# Patient Record
Sex: Female | Born: 1995 | Race: White | Hispanic: No | Marital: Single | Smoking: Never smoker
Health system: Southern US, Community
[De-identification: ages and names within clinical notes are randomized; demographics above are authoritative.]

## PROBLEM LIST (undated history)

## (undated) DIAGNOSIS — E079 Disorder of thyroid, unspecified: Secondary | ICD-10-CM

## (undated) DIAGNOSIS — J45909 Unspecified asthma, uncomplicated: Secondary | ICD-10-CM

## (undated) DIAGNOSIS — K900 Celiac disease: Secondary | ICD-10-CM

## (undated) DIAGNOSIS — E282 Polycystic ovarian syndrome: Secondary | ICD-10-CM

## (undated) HISTORY — PX: TONSILECTOMY, ADENOIDECTOMY, BILATERAL MYRINGOTOMY AND TUBES: SHX2538

## (undated) HISTORY — PX: TONSILLECTOMY: SUR1361

---
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
## 2013-09-10 DIAGNOSIS — K900 Celiac disease: Secondary | ICD-10-CM

## 2013-09-10 HISTORY — DX: Celiac disease: K90.0

## 2014-12-07 ENCOUNTER — Emergency Department (HOSPITAL_COMMUNITY)
Admission: EM | Admit: 2014-12-07 | Discharge: 2014-12-07 | Disposition: A | Discharge status: Home / Self Care | Payer: BLUE CROSS/BLUE SHIELD | Attending: Emergency Medicine | Admitting: Emergency Medicine

## 2014-12-07 ENCOUNTER — Encounter (HOSPITAL_COMMUNITY): Payer: Self-pay | Admitting: Emergency Medicine

## 2014-12-07 DIAGNOSIS — R1011 Right upper quadrant pain: Secondary | ICD-10-CM | POA: Insufficient documentation

## 2014-12-07 DIAGNOSIS — Z8639 Personal history of other endocrine, nutritional and metabolic disease: Secondary | ICD-10-CM | POA: Diagnosis not present

## 2014-12-07 DIAGNOSIS — Z3202 Encounter for pregnancy test, result negative: Secondary | ICD-10-CM | POA: Insufficient documentation

## 2014-12-07 DIAGNOSIS — R1012 Left upper quadrant pain: Secondary | ICD-10-CM | POA: Insufficient documentation

## 2014-12-07 DIAGNOSIS — Z7951 Long term (current) use of inhaled steroids: Secondary | ICD-10-CM | POA: Diagnosis not present

## 2014-12-07 DIAGNOSIS — Z8719 Personal history of other diseases of the digestive system: Secondary | ICD-10-CM | POA: Diagnosis not present

## 2014-12-07 DIAGNOSIS — Z79899 Other long term (current) drug therapy: Secondary | ICD-10-CM | POA: Diagnosis not present

## 2014-12-07 HISTORY — DX: Celiac disease: K90.0

## 2014-12-07 HISTORY — DX: Polycystic ovarian syndrome: E28.2

## 2014-12-07 LAB — URINALYSIS, ROUTINE W REFLEX MICROSCOPIC
Bilirubin Urine: NEGATIVE
GLUCOSE, UA: NEGATIVE mg/dL
Hgb urine dipstick: NEGATIVE
Ketones, ur: NEGATIVE mg/dL
Leukocytes, UA: NEGATIVE
Nitrite: NEGATIVE
PROTEIN: NEGATIVE mg/dL
Specific Gravity, Urine: 1.017 (ref 1.005–1.030)
Urobilinogen, UA: 0.2 mg/dL (ref 0.0–1.0)
pH: 6 (ref 5.0–8.0)

## 2014-12-07 LAB — COMPREHENSIVE METABOLIC PANEL
ALK PHOS: 61 U/L (ref 39–117)
ALT: 19 U/L (ref 0–35)
ANION GAP: 9 (ref 5–15)
AST: 22 U/L (ref 0–37)
Albumin: 4.2 g/dL (ref 3.5–5.2)
BILIRUBIN TOTAL: 0.2 mg/dL — AB (ref 0.3–1.2)
BUN: 13 mg/dL (ref 6–23)
CALCIUM: 9.4 mg/dL (ref 8.4–10.5)
CHLORIDE: 107 mmol/L (ref 96–112)
CO2: 25 mmol/L (ref 19–32)
CREATININE: 0.77 mg/dL (ref 0.50–1.10)
GFR calc Af Amer: >90 mL/min (ref >90)
GFR calc non Af Amer: >90 mL/min (ref >90)
GLUCOSE: 109 mg/dL — AB (ref 70–99)
POTASSIUM: 4 mmol/L (ref 3.5–5.1)
Sodium: 141 mmol/L (ref 135–145)
Total Protein: 7.4 g/dL (ref 6.0–8.3)

## 2014-12-07 LAB — CBC WITH DIFFERENTIAL/PLATELET
Basophils Absolute: 0 10*3/uL (ref 0.0–0.1)
Basophils Relative: 0 % (ref 0–1)
Eosinophils Absolute: 0.3 10*3/uL (ref 0.0–0.7)
Eosinophils Relative: 3 % (ref 0–5)
HEMATOCRIT: 40.7 % (ref 36.0–46.0)
HEMOGLOBIN: 13.3 g/dL (ref 12.0–15.0)
Lymphocytes Relative: 42 % (ref 12–46)
Lymphs Abs: 4 10*3/uL (ref 0.7–4.0)
MCH: 29.2 pg (ref 26.0–34.0)
MCHC: 32.7 g/dL (ref 30.0–36.0)
MCV: 89.3 fL (ref 78.0–100.0)
MONO ABS: 0.7 10*3/uL (ref 0.1–1.0)
MONOS PCT: 8 % (ref 3–12)
NEUTROS PCT: 47 % (ref 43–77)
Neutro Abs: 4.7 10*3/uL (ref 1.7–7.7)
Platelets: 354 10*3/uL (ref 150–400)
RBC: 4.56 MIL/uL (ref 3.87–5.11)
RDW: 13.3 % (ref 11.5–15.5)
WBC: 9.7 10*3/uL (ref 4.0–10.5)

## 2014-12-07 LAB — POC URINE PREG, ED: Preg Test, Ur: NEGATIVE

## 2014-12-07 LAB — LIPASE, BLOOD: LIPASE: 48 U/L (ref 11–59)

## 2014-12-07 MED ORDER — ONDANSETRON 8 MG PO TBDP: 8.0000 mg | ORAL_TABLET | Freq: Three times a day (TID) | ORAL | Status: DC | PRN

## 2014-12-07 MED ORDER — ONDANSETRON 8 MG PO TBDP
8.0000 mg | ORAL_TABLET | Freq: Once | ORAL | Status: AC
  Administered 2014-12-07: 8 mg via ORAL
  Filled 2014-12-07: qty 1

## 2014-12-07 NOTE — ED Provider Notes (Signed)
CSN: [id number]     Arrival date & time 12/07/14  0049 History   First MD Initiated Contact with Patient 12/07/14 0539     Chief Complaint  Patient presents with  . Abdominal Pain     (Consider location/radiation/quality/duration/timing/severity/associated sxs/prior Treatment) HPI  This is a 19 year old female with known celiac disease. She has had a one-month history of a feeling of abdominal bloating with intermittent pain. The pain occurs primarily in the epigastrium and right upper quadrant. It is sharp in nature. There does not seem to be a particular trigger; it is not associated with food. The pain became severe yesterday evening and was associated with nausea but no vomiting or diarrhea. She was given Zofran with relief of the nausea and the pain is subsequently significantly improved. Her pain now is mild, worse with palpation. There was no associated fever or chills. An ultrasound of her gallbladder has been discussed but has not been scheduled by her gastroenterologist.  Past Medical History  Diagnosis Date  . PCOS (polycystic ovarian syndrome)   . Celiac disease    Past Surgical History  Procedure Laterality Date  . Tonsillectomy     History reviewed. No pertinent family history. History  Substance Use Topics  . Smoking status: Never Smoker   . Smokeless tobacco: Not on file  . Alcohol Use: Yes     Comment: occ   OB History    No data available     Review of Systems  All other systems reviewed and are negative.   Allergies  Gluten meal  Home Medications   Prior to Admission medications   Medication Sig Start Date End Date Taking? Authorizing Provider  dicyclomine (BENTYL) 10 MG capsule Take 10-20 mg by mouth 2 (two) times daily. Take 1 capsule in the morning Take 2 capsules in the evening   Yes Historical Provider, MD  fluticasone (FLONASE) 50 MCG/ACT nasal spray Place 1 spray into both nostrils daily.   Yes Historical Provider, MD  loratadine (CLARITIN) 10  MG tablet Take 10 mg by mouth daily.   Yes Historical Provider, MD  metFORMIN (GLUCOPHAGE) 500 MG tablet Take 500 mg by mouth 2 (two) times daily with a meal.   Yes Historical Provider, MD  omeprazole (PRILOSEC) 20 MG capsule Take 20 mg by mouth daily.   Yes Historical Provider, MD  PRESCRIPTION MEDICATION Take 1 tablet by mouth daily. Birth control   Yes Historical Provider, MD  pseudoephedrine (SUDAFED) 30 MG tablet Take 30 mg by mouth every 4 (four) hours as needed for congestion.   Yes Historical Provider, MD  rifaximin (XIFAXAN) 550 MG TABS tablet Take 550 mg by mouth daily.   Yes Historical Provider, MD   BP 115/60 mmHg  Pulse 79  Temp(Src) 98.1 F (36.7 C) (Oral)  Resp 18  Ht  (1.676 m)  Wt 180 lb (81.647 kg)  BMI 29.07 kg/m2  SpO2 99%  LMP 11/16/2014 (Approximate)   Physical Exam  General: Well-developed, well-nourished female in no acute distress; appearance consistent with age of record HENT: normocephalic; atraumatic Eyes: pupils equal, round and reactive to light; extraocular muscles intact Neck: supple Heart: regular rate and rhythm Lungs: clear to auscultation bilaterally Abdomen: soft; nondistended; mild left upper quadrant and right upper quadrant tenderness; no masses or hepatosplenomegaly; bowel sounds present; gallbladder not visualized on bedside ultrasound Extremities: No deformity; full range of motion; pulses normal Neurologic: Awake, alert and oriented; motor function intact in all extremities and symmetric; no facial droop Skin:  Warm and dry Psychiatric: Normal mood and affect    ED Course  Procedures (including critical care time)   MDM   Nursing notes and vitals signs, including pulse oximetry, reviewed.  Summary of this visit's results, reviewed by myself:  Labs:  Results for orders placed or performed during the hospital encounter of 12/07/14 (from the past 24 hour(s))  Urinalysis, Routine w reflex microscopic     Status: None    Collection Time: 12/07/14  1:08 AM  Result Value Ref Range   Color, Urine YELLOW YELLOW   APPearance CLEAR CLEAR   Specific Gravity, Urine 1.017 1.005 - 1.030   pH 6.0 5.0 - 8.0   Glucose, UA NEGATIVE NEGATIVE mg/dL   Hgb urine dipstick NEGATIVE NEGATIVE   Bilirubin Urine NEGATIVE NEGATIVE   Ketones, ur NEGATIVE NEGATIVE mg/dL   Protein, ur NEGATIVE NEGATIVE mg/dL   Urobilinogen, UA 0.2 0.0 - 1.0 mg/dL   Nitrite NEGATIVE NEGATIVE   Leukocytes, UA NEGATIVE NEGATIVE  CBC with Differential     Status: None   Collection Time: 12/07/14  1:11 AM  Result Value Ref Range   WBC 9.7 4.0 - 10.5 K/uL   RBC 4.56 3.87 - 5.11 MIL/uL   Hemoglobin 13.3 12.0 - 15.0 g/dL   HCT 04.5 40.9 - 81.1 %   MCV 89.3 78.0 - 100.0 fL   MCH 29.2 26.0 - 34.0 pg   MCHC 32.7 30.0 - 36.0 g/dL   RDW 91.4 78.2 - 95.6 %   Platelets 354 150 - 400 K/uL   Neutrophils Relative % 47 43 - 77 %   Neutro Abs 4.7 1.7 - 7.7 K/uL   Lymphocytes Relative 42 12 - 46 %   Lymphs Abs 4.0 0.7 - 4.0 K/uL   Monocytes Relative 8 3 - 12 %   Monocytes Absolute 0.7 0.1 - 1.0 K/uL   Eosinophils Relative 3 0 - 5 %   Eosinophils Absolute 0.3 0.0 - 0.7 K/uL   Basophils Relative 0 0 - 1 %   Basophils Absolute 0.0 0.0 - 0.1 K/uL  Comprehensive metabolic panel     Status: Abnormal   Collection Time: 12/07/14  1:11 AM  Result Value Ref Range   Sodium 141 135 - 145 mmol/L   Potassium 4.0 3.5 - 5.1 mmol/L   Chloride 107 96 - 112 mmol/L   CO2 25 19 - 32 mmol/L   Glucose, Bld 109 (H) 70 - 99 mg/dL   BUN 13 6 - 23 mg/dL   Creatinine, Ser 2.13 0.50 - 1.10 mg/dL   Calcium 9.4 8.4 - 08.6 mg/dL   Total Protein 7.4 6.0 - 8.3 g/dL   Albumin 4.2 3.5 - 5.2 g/dL   AST 22 0 - 37 U/L   ALT 19 0 - 35 U/L   Alkaline Phosphatase 61 39 - 117 U/L   Total Bilirubin 0.2 (L) 0.3 - 1.2 mg/dL   GFR calc non Af Amer >90 >90 mL/min   GFR calc Af Amer >90 >90 mL/min   Anion gap 9 5 - 15  Lipase, blood     Status: None   Collection Time: 12/07/14  1:11 AM   Result Value Ref Range   Lipase 48 11 - 59 U/L  POC Urine Pregnancy, ED  (If Pre-menopausal female) - do not order at [facility]     Status: None   Collection Time: 12/07/14  1:54 AM  Result Value Ref Range   Preg Test, Ur NEGATIVE NEGATIVE   5:52 AM  We'll schedule an outpatient ultrasound of the abdomen.  Paula LibraJohn Atlas Crossland, MD 12/07/14 [phone]

## 2014-12-07 NOTE — ED Notes (Signed)
Pt resting quietly with no acute distress, watching television when entering the room.

## 2014-12-07 NOTE — ED Notes (Signed)
Patient reports abd pain, states she has ciliac disease, recently was scoped and was to be scheduled for ultrasound to r/o gallbladder. Patient c/o nausea, denies vomiting, states she feels bloated for @ 1 month.

## 2014-12-30 ENCOUNTER — Ambulatory Visit (INDEPENDENT_AMBULATORY_CARE_PROVIDER_SITE_OTHER): Payer: BLUE CROSS/BLUE SHIELD | Admitting: Physician Assistant

## 2014-12-30 VITALS — BP 110/70 | HR 86 | Temp 97.8°F | Resp 17 | Ht 65.0 in | Wt 190.0 lb

## 2014-12-30 DIAGNOSIS — J069 Acute upper respiratory infection, unspecified: Secondary | ICD-10-CM | POA: Diagnosis not present

## 2014-12-30 DIAGNOSIS — K900 Celiac disease: Secondary | ICD-10-CM | POA: Insufficient documentation

## 2014-12-30 DIAGNOSIS — B9789 Other viral agents as the cause of diseases classified elsewhere: Principal | ICD-10-CM

## 2014-12-30 DIAGNOSIS — J309 Allergic rhinitis, unspecified: Secondary | ICD-10-CM | POA: Diagnosis not present

## 2014-12-30 MED ORDER — FLUTICASONE PROPIONATE 50 MCG/ACT NA SUSP: 2.0000 | Freq: Every day | NASAL | Status: AC

## 2014-12-30 MED ORDER — HYDROCOD POLST-CPM POLST ER 10-8 MG/5ML PO SUER: 5.0000 mL | Freq: Two times a day (BID) | ORAL | Status: AC | PRN

## 2014-12-30 NOTE — Progress Notes (Signed)
Subjective:    Patient ID: Holly Gibbs, female    DOB: 05/09/1996, 19 y.o.   MRN: [id number]  HPI  This is a 19 year old female with PMH allergic rhinitis and celiac disease who is presenting with 3 days of nasal congestion, dry cough and bilateral ear pain. Some nasal discharge. Mild sore throat. Cough is worse at night. Denies fever, chills, SOB, wheezing. Takes sudafed and claritin daily for allergies. She generally uses flonase daily as well but has run out. She has been using saline nasal spray and mucinex for current symptoms and helping some. She does not have a history of asthma and she is not a smoker.  Review of Systems  Constitutional: Negative for fever and chills.  HENT: Positive for congestion, ear pain and sore throat. Negative for ear discharge and sinus pressure.   Eyes: Negative for redness.  Respiratory: Positive for cough. Negative for shortness of breath and wheezing.   Gastrointestinal: Negative for nausea and vomiting.  Skin: Negative for rash.  Allergic/Immunologic: Positive for environmental allergies.  Hematological: Negative for adenopathy.  Psychiatric/Behavioral: Positive for sleep disturbance.    There are no active problems to display for this patient.  Prior to Admission medications   Medication Sig Start Date End Date Taking? Authorizing Provider  dicyclomine (BENTYL) 10 MG capsule Take 10-20 mg by mouth 2 (two) times daily. Take 1 capsule in the morning Take 2 capsules in the evening   Yes Historical Provider, MD  loratadine (CLARITIN) 10 MG tablet Take 10 mg by mouth daily.   Yes Historical Provider, MD  metFORMIN (GLUCOPHAGE) 500 MG tablet Take 500 mg by mouth 2 (two) times daily with a meal.   Yes Historical Provider, MD  PRESCRIPTION MEDICATION Take 1 tablet by mouth daily. Birth control   Yes Historical Provider, MD  pseudoephedrine (SUDAFED) 30 MG tablet Take 30 mg by mouth every 4 (four) hours as needed for congestion.   Yes Historical  Provider, MD                               Allergies  Allergen Reactions  . Gluten Meal Other (See Comments)    sensitivity   Patient's social and family history were reviewed.     Objective:   Physical Exam  Constitutional: She is oriented to person, place, and time. She appears well-developed and well-nourished. No distress.  HENT:  Head: Normocephalic and atraumatic.  Right Ear: Hearing, external ear and ear canal normal. A middle ear effusion is present.  Left Ear: Hearing, external ear and ear canal normal. A middle ear effusion is present.  Nose: Mucosal edema present. Right sinus exhibits no maxillary sinus tenderness and no frontal sinus tenderness. Left sinus exhibits no maxillary sinus tenderness and no frontal sinus tenderness.  Mouth/Throat: Uvula is midline and mucous membranes are normal. Posterior oropharyngeal erythema present. No oropharyngeal exudate or posterior oropharyngeal edema.  Eyes: Conjunctivae and lids are normal. Right eye exhibits no discharge. Left eye exhibits no discharge. No scleral icterus.  Cardiovascular: Normal rate, regular rhythm, normal heart sounds and normal pulses.   No murmur heard. Pulmonary/Chest: Effort normal and breath sounds normal. No respiratory distress. She has no wheezes. She has no rhonchi. She has no rales.  Musculoskeletal: Normal range of motion.  Lymphadenopathy:       Head (right side): No submental, no submandibular and no tonsillar adenopathy present.       Head (  left side): No submental, no submandibular and no tonsillar adenopathy present.    She has no cervical adenopathy.  Neurological: She is alert and oriented to person, place, and time.  Skin: Skin is warm, dry and intact. No lesion and no rash noted.  Psychiatric: She has a normal mood and affect. Her speech is normal and behavior is normal. Thought content normal.   BP 110/70 mmHg  Pulse 86  Temp(Src) 97.8 F (36.6 C) (Oral)  Resp 17  Ht  (1.651 m)   Wt 190 lb (86.183 kg)  BMI 31.62 kg/m2  SpO2 99%  LMP 11/16/2014 (Approximate)     Assessment & Plan:  1. Viral URI with cough 2. Allergic rhinitis Tussionex and mucinex for viral URI. Flonase for URI and to be continued for allergic rhinitis. She will return if symptoms do not improve in 7-10 days.  - fluticasone (FLONASE) 50 MCG/ACT nasal spray; Place 2 sprays into both nostrils daily.  Dispense: 16 g; Refill: 12 - chlorpheniramine-HYDROcodone (TUSSIONEX PENNKINETIC ER) 10-8 MG/5ML SUER; Take 5 mLs by mouth every 12 (twelve) hours as needed for cough.  Dispense: 100 mL; Refill: 0   [place] [employer]. Dyke Brackett, MHS [facility]  12/30/2014

## 2014-12-30 NOTE — Patient Instructions (Signed)
Continue daily claritin and sudafed. Start using flonase again daily. Use cough syrup at night for sleep. Return in 7-10 days if symptoms are not improving.

## 2017-07-26 ENCOUNTER — Other Ambulatory Visit: Payer: Self-pay

## 2017-07-26 ENCOUNTER — Emergency Department (HOSPITAL_COMMUNITY)
Admission: EM | Admit: 2017-07-26 | Discharge: 2017-07-26 | Disposition: A | Discharge status: Home / Self Care | Payer: BLUE CROSS/BLUE SHIELD | Attending: Emergency Medicine | Admitting: Emergency Medicine

## 2017-07-26 ENCOUNTER — Emergency Department (HOSPITAL_COMMUNITY): Payer: BLUE CROSS/BLUE SHIELD

## 2017-07-26 ENCOUNTER — Encounter (HOSPITAL_COMMUNITY): Payer: Self-pay

## 2017-07-26 DIAGNOSIS — J302 Other seasonal allergic rhinitis: Secondary | ICD-10-CM | POA: Diagnosis not present

## 2017-07-26 DIAGNOSIS — J45909 Unspecified asthma, uncomplicated: Secondary | ICD-10-CM | POA: Insufficient documentation

## 2017-07-26 DIAGNOSIS — R519 Headache, unspecified: Secondary | ICD-10-CM

## 2017-07-26 DIAGNOSIS — E079 Disorder of thyroid, unspecified: Secondary | ICD-10-CM | POA: Insufficient documentation

## 2017-07-26 DIAGNOSIS — R51 Headache: Secondary | ICD-10-CM | POA: Insufficient documentation

## 2017-07-26 DIAGNOSIS — Z79899 Other long term (current) drug therapy: Secondary | ICD-10-CM | POA: Diagnosis not present

## 2017-07-26 HISTORY — DX: Disorder of thyroid, unspecified: E07.9

## 2017-07-26 HISTORY — DX: Unspecified asthma, uncomplicated: J45.909

## 2017-07-26 LAB — I-STAT BETA HCG BLOOD, ED (MC, WL, AP ONLY): I-stat hCG, quantitative: <5 m[IU]/mL (ref <5)

## 2017-07-26 MED ORDER — METOCLOPRAMIDE HCL 5 MG/ML IJ SOLN
10.0000 mg | Freq: Once | INTRAMUSCULAR | Status: AC
  Administered 2017-07-26: 10 mg via INTRAVENOUS
  Filled 2017-07-26: qty 2

## 2017-07-26 MED ORDER — BUTALBITAL-APAP-CAFFEINE 50-325-40 MG PO TABS: 1.0000 | ORAL_TABLET | Freq: Three times a day (TID) | ORAL | 0 refills | Status: AC | PRN

## 2017-07-26 MED ORDER — SODIUM CHLORIDE 0.9 % IV BOLUS (SEPSIS)
1000.0000 mL | Freq: Once | INTRAVENOUS | Status: AC
  Administered 2017-07-26: 1000 mL via INTRAVENOUS

## 2017-07-26 MED ORDER — KETOROLAC TROMETHAMINE 30 MG/ML IJ SOLN
30.0000 mg | Freq: Once | INTRAMUSCULAR | Status: AC
  Administered 2017-07-26: 30 mg via INTRAVENOUS
  Filled 2017-07-26: qty 1

## 2017-07-26 NOTE — ED Triage Notes (Addendum)
Pt presents to ED with a headache that started today and hasn't been able to get rid of it.  Pt states she iced her head and took tylenol with no relief. Pt also states she has left arm nerve pain from elbow to her pinky that started 4 days ago. Pt denies a fever, cough or congestion.

## 2017-07-26 NOTE — ED Provider Notes (Signed)
[facility]-EMERGENCY DEPT Provider Note   CSN: [id number] Arrival date & time: 07/26/17  0007    History   Chief Complaint Chief Complaint  Patient presents with  . Headache    posterior and down right neck    HPI Holly Gibbs is a 21 y.o. female.  21 y/o female with hx of thyroid disease, celiac, and PCOS presents to the ED for evaluation of headache.  Patient reports a right parietal headache which started today and has been constant and gradually worsening.  She specifically noted worsening at 2000 this evening at which time she took ibuprofen without relief.  She has also tried icing the area of pain without improvement.  She had a small amount of caffeine in her coffee this morning.  She denies any head trauma or loss of consciousness.  No recent fevers.  She has not had any blurry vision, vision loss, tinnitus, hearing loss, difficulty speaking or swallowing, she was extremity weakness, inability to ambulate.  Patient does report onset of severe pain in her left upper extremity 4 days ago.  She is right-hand dominant.  She denies any trauma or injury to her left upper extremity, but had severe, "searing" pain along the ulnar aspect of her forearm extending to her 5th finger.  This was worse with movement, but also aggravated "when a sheet was on my arm".  These symptoms have largely improved, but have not completely resolved.   The history is provided by the patient. No language interpreter was used.  Headache      Past Medical History:  Diagnosis Date  . Asthma    exercise induced  . Celiac disease   . Celiac disease 2015  . PCOS (polycystic ovarian syndrome)   . PCOS (polycystic ovarian syndrome) 2010  . Thyroid disease     Patient Active Problem List   Diagnosis Date Noted  . Celiac disease 12/30/2014  . Rhinitis, allergic 12/30/2014    Past Surgical History:  Procedure Laterality Date  . TONSILECTOMY, ADENOIDECTOMY, BILATERAL  MYRINGOTOMY AND TUBES    . TONSILLECTOMY      OB History    No data available       Home Medications    Prior to Admission medications   Medication Sig Start Date End Date Taking? Authorizing Provider  butalbital-acetaminophen-caffeine (FIORICET, ESGIC) 50-325-40 MG tablet Take 1-2 tablets every 8 (eight) hours as needed by mouth for headache. 07/26/17 07/26/18  Antony MaduraHumes, Leviathan Macera, PA-C  chlorpheniramine-HYDROcodone (TUSSIONEX PENNKINETIC ER) 10-8 MG/5ML SUER Take 5 mLs by mouth every 12 (twelve) hours as needed for cough. 12/30/14   Dorna LeitzBush, Nicole V, PA-C  dicyclomine (BENTYL) 10 MG capsule Take 10-20 mg by mouth 2 (two) times daily. Take 1 capsule in the morning Take 2 capsules in the evening    [provider]  fluticasone (FLONASE) 50 MCG/ACT nasal spray Place 2 sprays into both nostrils daily. 12/30/14   Dorna LeitzBush, Nicole V, PA-C  loratadine (CLARITIN) 10 MG tablet Take 10 mg by mouth daily.    [provider]  metFORMIN (GLUCOPHAGE) 500 MG tablet Take 500 mg by mouth 2 (two) times daily with a meal.    [provider]  omeprazole (PRILOSEC) 20 MG capsule Take 20 mg by mouth daily.    [provider]  PRESCRIPTION MEDICATION Take 1 tablet by mouth daily. Birth control    [provider]  pseudoephedrine (SUDAFED) 30 MG tablet Take 30 mg by mouth every 4 (four) hours as needed for  congestion.    [provider]    Family History Family History  Problem Relation Age of Onset  . Diabetes Father   . Thyroid disease Father     Social History Social History   Tobacco Use  . Smoking status: Never Smoker  . Smokeless tobacco: Never Used  Substance Use Topics  . Alcohol use: Yes    Alcohol/week: 0.6 oz    Types: 1 Standard drinks or equivalent per week    Comment: occ  . Drug use: No     Allergies   Gluten meal   Review of Systems Review of Systems  Neurological: Positive for headaches.  Ten systems reviewed and are negative  for acute change, except as noted in the HPI.    Physical Exam Updated Vital Signs BP 126/81   Pulse 75   Temp 97.7 F (36.5 C) (Oral)   Resp 18   Ht 5\' 6"  (1.676 m)   Wt 88.5 kg (195 lb)   LMP 07/11/2017 (Approximate)   SpO2 98%   BMI 31.47 kg/m   Physical Exam  Constitutional: She is oriented to person, place, and time. She appears well-developed and well-nourished. No distress.  Nontoxic appearing and in NAD  HENT:  Head: Normocephalic and atraumatic.  Mouth/Throat: Oropharynx is clear and moist.  Symmetric rise of the uvula with phonation  Eyes: Conjunctivae and EOM are normal. Pupils are equal, round, and reactive to light. No scleral icterus.  Neck: Normal range of motion.  No meningismus  Cardiovascular: Normal rate, regular rhythm and intact distal pulses.  Pulmonary/Chest: Effort normal. No stridor. No respiratory distress.  Respirations even and unlabored  Musculoskeletal: Normal range of motion.  Neurological: She is alert and oriented to person, place, and time. No cranial nerve deficit. She exhibits normal muscle tone. Coordination normal.  GCS 15. Speech is goal oriented. No cranial nerve deficits appreciated; symmetric eyebrow raise, no facial drooping, tongue midline. Patient has equal grip strength bilaterally with 5/5 strength against resistance in all major muscle groups bilaterally. Sensation to light touch intact. Patient moves extremities without ataxia. Normal finger-nose-finger.   Skin: Skin is warm and dry. No rash noted. She is not diaphoretic. No erythema. No pallor.  Psychiatric: She has a normal mood and affect. Her behavior is normal.  Nursing note and vitals reviewed.    ED Treatments / Results  Labs (all labs ordered are listed, but only abnormal results are displayed) Labs Reviewed  I-STAT BETA HCG BLOOD, ED ([facility], [facility], [facility] ONLY)    EKG  EKG Interpretation None       Radiology Ct Head Wo Contrast  Result Date: 07/26/2017 CLINICAL  DATA:  21 year old female with headache. EXAM: CT HEAD WITHOUT CONTRAST TECHNIQUE: Contiguous axial images were obtained from the base of the skull through the vertex without intravenous contrast. COMPARISON:  None. FINDINGS: Brain: No evidence of acute infarction, hemorrhage, hydrocephalus, extra-axial collection or mass lesion/mass effect. Vascular: No hyperdense vessel or unexpected calcification. Skull: Normal. Negative for fracture or focal lesion. Sinuses/Orbits: No acute finding. Other: None IMPRESSION: Normal unenhanced CT of the brain. Electronically Signed   By: Elgie Collard M.D.   On: 07/26/2017 03:27    Procedures Procedures (including critical care time)  Medications Ordered in ED Medications  ketorolac (TORADOL) 30 MG/ML injection 30 mg (30 mg Intravenous Given 07/26/17 0240)  metoCLOPramide (REGLAN) injection 10 mg (10 mg Intravenous Given 07/26/17 0240)  sodium chloride 0.9 % bolus 1,000 mL (1,000 mLs Intravenous New Bag/Given 07/26/17  [id number])    3:34 AM Patient reassessed. She reports significant improvement in headache. Denies need for additional medications.   Initial Impression / Assessment and Plan / ED Course  I have reviewed the triage vital signs and the nursing notes.  Pertinent labs & imaging results that were available during my care of the patient were reviewed by me and considered in my medical decision making (see chart for details).     21 year old female presents to the emergency department for evaluation of headache which began this morning.  It was gradual in onset and worsened at 2000.  She reports no improvement with home remedies and over-the-counter medications.  She has no history of recent head injury or trauma.  No fever, nuchal rigidity, meningismus to suggest meningitis.  Neurologic exam today is nonfocal.  CT obtained given lack of history of similar headache symptoms.  Noncontrasted CT is reassuring today.  The patient has had significant  improvement in her headache following a migraine cocktail.  I do not believe further emergent workup is indicated, but will refer to neurology should symptoms persist.  Return precautions discussed and provided.  Patient discharged in stable condition with no unaddressed concerns.    Final Clinical Impressions(s) / ED Diagnoses   Final diagnoses:  Bad headache    ED Discharge Orders        Ordered    butalbital-acetaminophen-caffeine (FIORICET, ESGIC) 50-325-40 MG tablet  Every 8 hours PRN     07/26/17 0333       Antony MaduraHumes, Wilberto Console, PA-C 07/26/17 [id number]    Palumbo, April, MD 07/26/17 [phone]

## 2019-01-22 IMAGING — CT CT HEAD W/O CM
3 of 4 series · 16 of 47 positions shown, 19 images · non-contrast
Comparison: None.

CLINICAL DATA: 21-year-old female with headache.

EXAM:
CT HEAD WITHOUT CONTRAST
TECHNIQUE: Contiguous axial images were obtained from the base of the skull
through the vertex without intravenous contrast.

[Series 2: head w/o · axial · non-contrast · 0.46mm/px · z∈[+1376,+1501]mm · 10 of 31 slices shown, 13 images]
[im 3/31  brain]
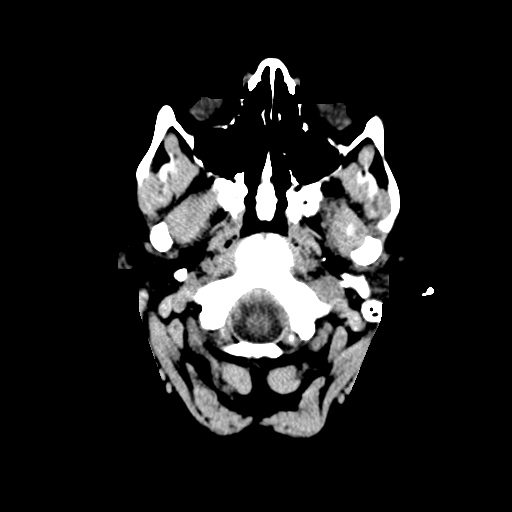
[im 3/31  bone]
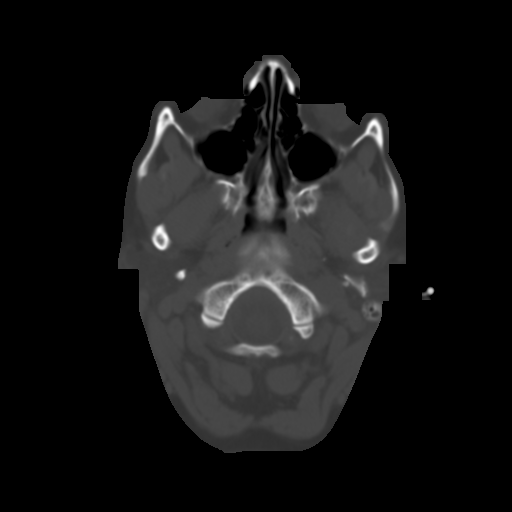
[im 5/31  brain]
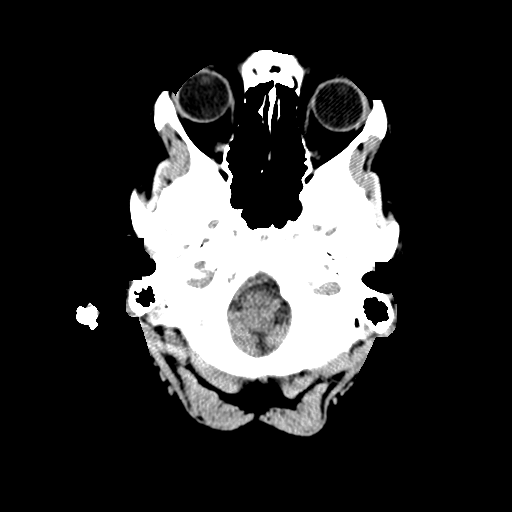
[im 9/31  brain]
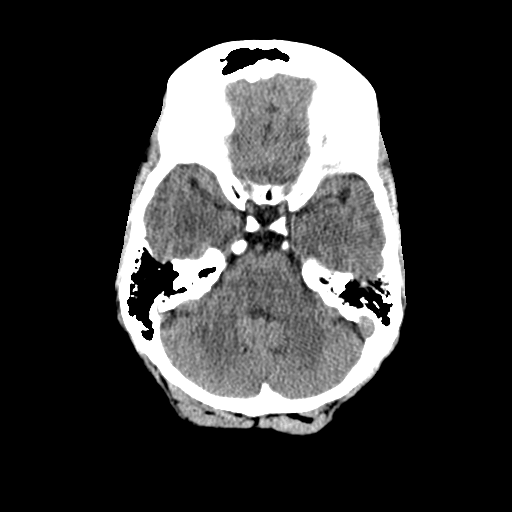
[im 11/31  brain]
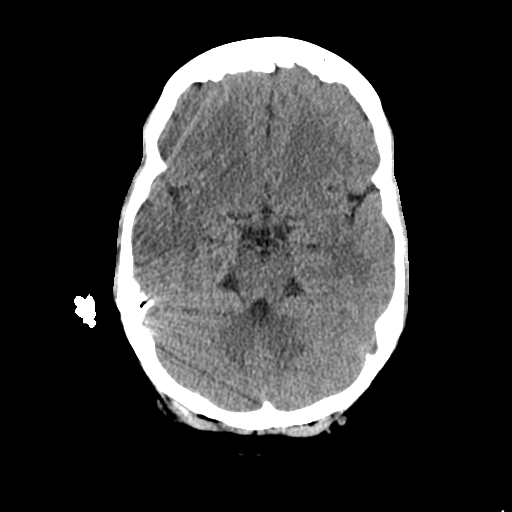
[im 13/31  brain]
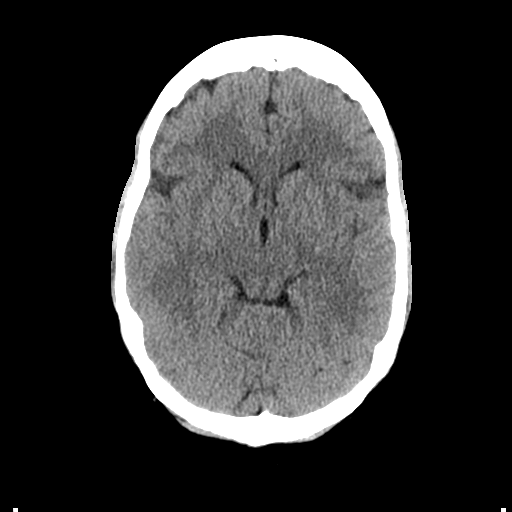
[im 13/31  bone]
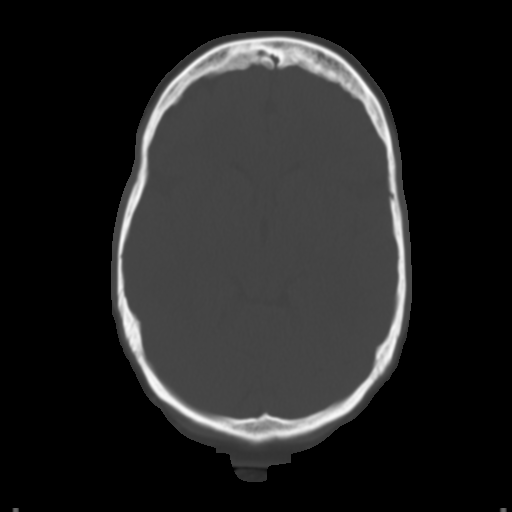
[im 18/31  brain]
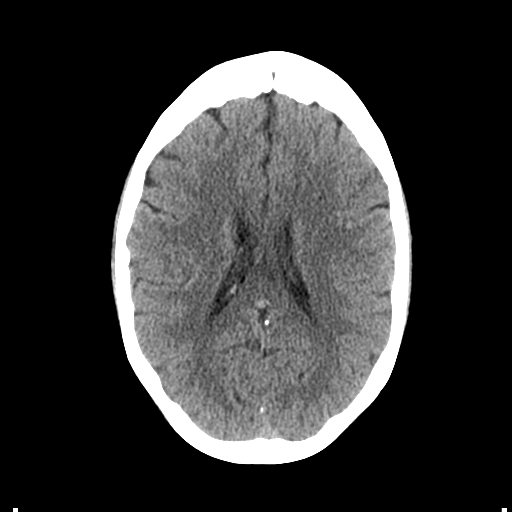
[im 20/31  brain]
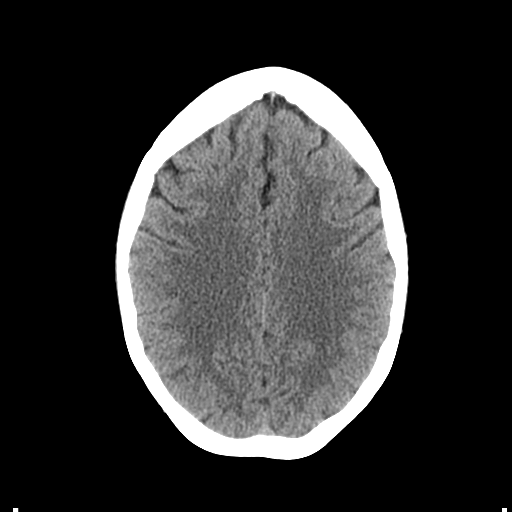
[im 22/31  brain]
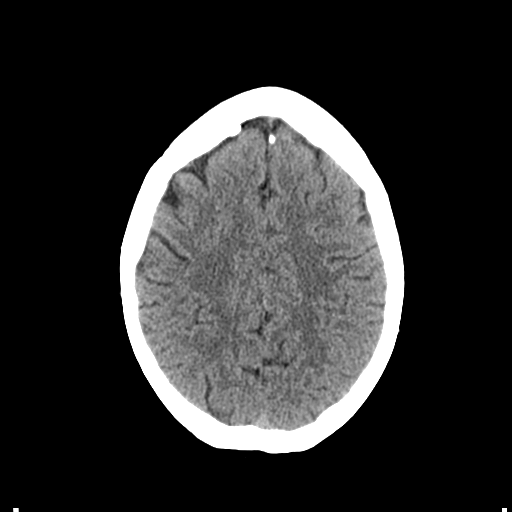
[im 26/31  brain]
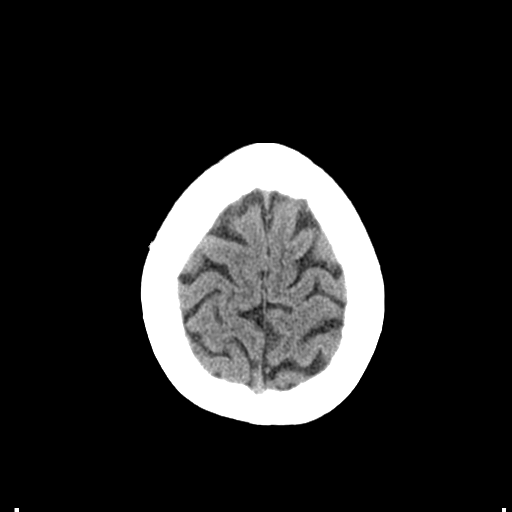
[im 26/31  bone]
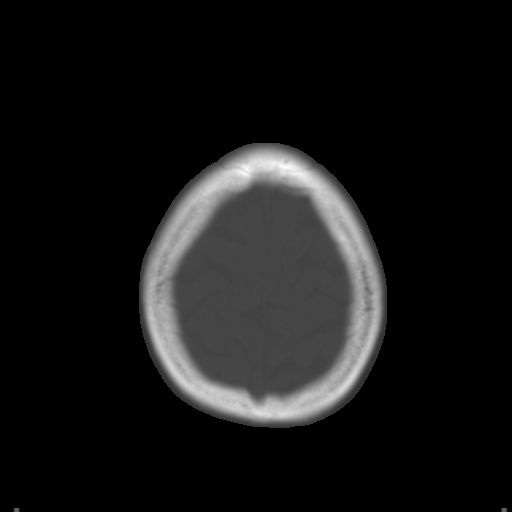
[im 28/31  brain]
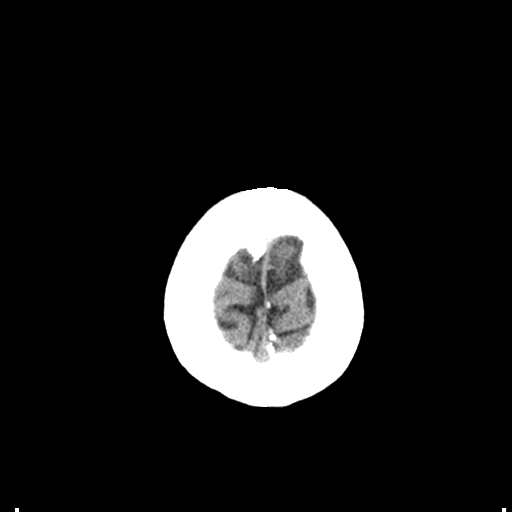

[Series 4: coronal · coronal · 0.28mm/px · 3 of 66 slices shown]
[im 22/66  brain]
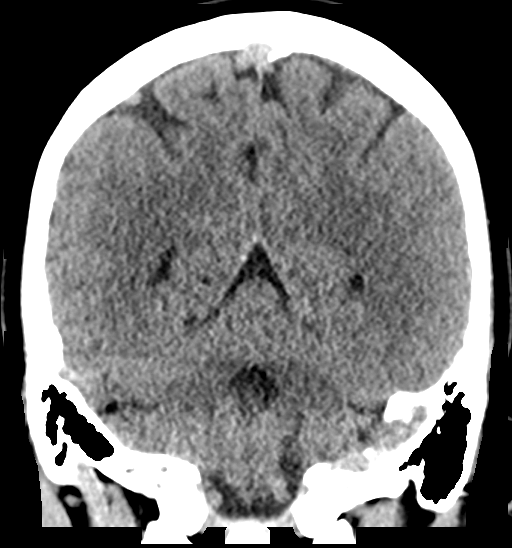
[im 29/66  brain]
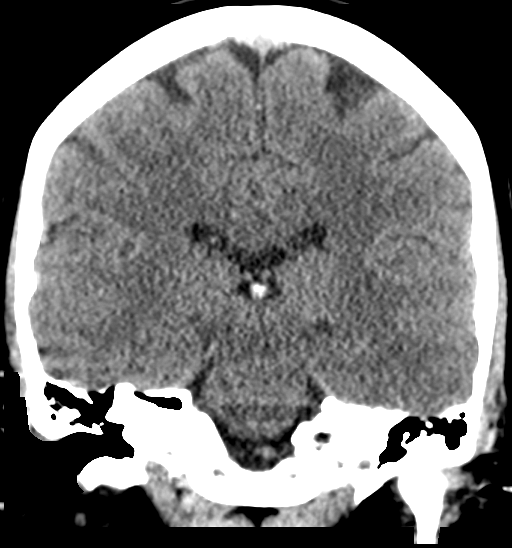
[im 37/66  brain]
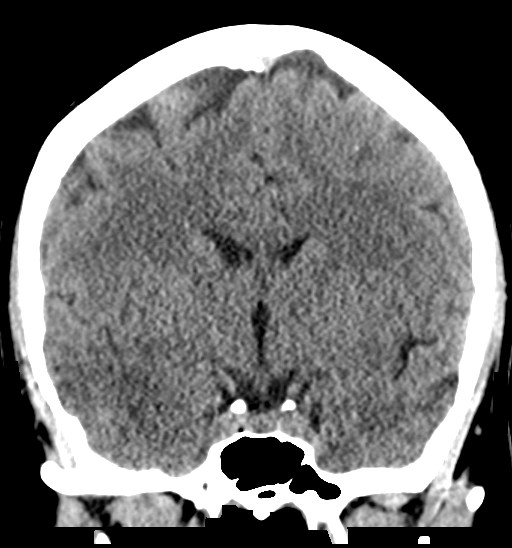

[Series 5: sagittal · sagittal · 0.30mm/px · 3 of 48 slices shown]
[im 16/48  brain]
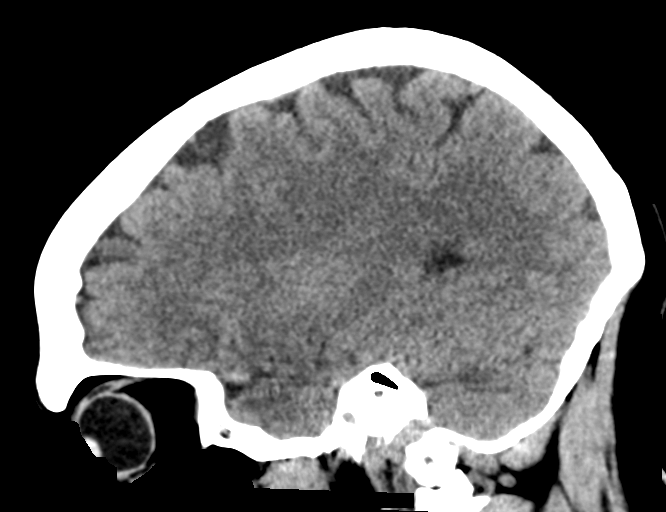
[im 24/48  brain]
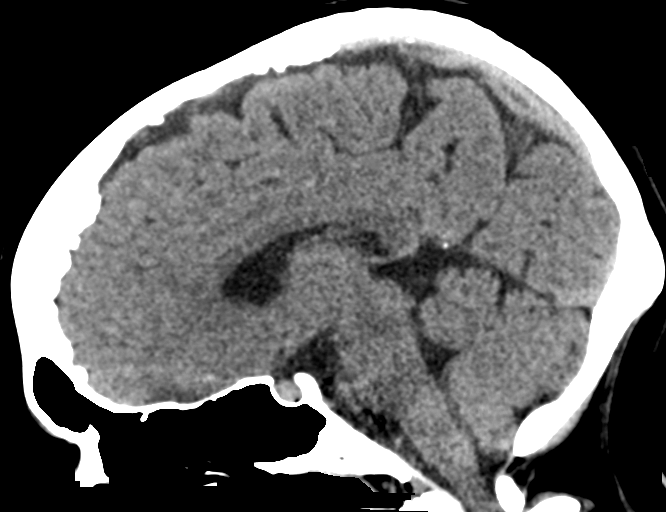
[im 32/48  brain]
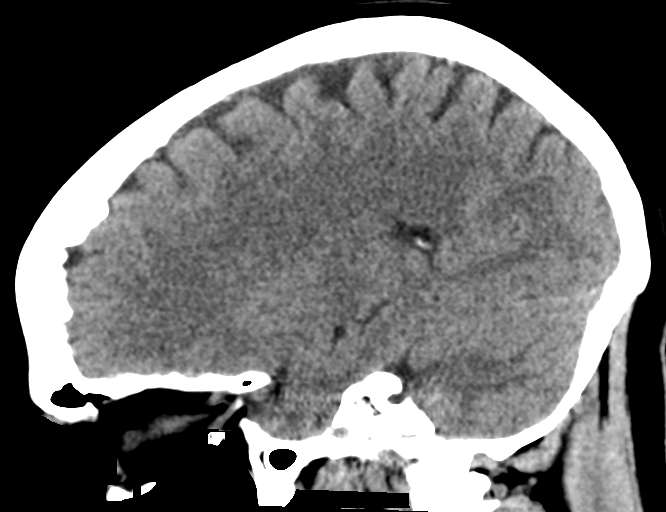

[16 of 47 positions shown; findings below may reference images not displayed]

FINDINGS: Brain: No evidence of acute infarction, hemorrhage, hydrocephalus,
extra-axial collection or mass lesion/mass effect.

Vascular: No hyperdense vessel or unexpected calcification.

Skull: Normal. Negative for fracture or focal lesion.

Sinuses/Orbits: No acute finding.

Other: None
IMPRESSION: Normal unenhanced CT of the brain.
